# Patient Record
Sex: Male | Born: 1980 | Race: Black or African American | Hispanic: No | Marital: Single | Smoking: Never smoker
Health system: Southern US, Community
[De-identification: ages and names within clinical notes are randomized; demographics above are authoritative.]

---

## 2017-08-15 ENCOUNTER — Emergency Department
Admission: EM | Admit: 2017-08-15 | Discharge: 2017-08-15 | Disposition: A | Discharge status: Home / Self Care | Payer: 59 | Attending: Emergency Medicine | Admitting: Emergency Medicine

## 2017-08-15 ENCOUNTER — Encounter: Payer: Self-pay | Admitting: Emergency Medicine

## 2017-08-15 ENCOUNTER — Emergency Department: Payer: 59

## 2017-08-15 DIAGNOSIS — S63502A Unspecified sprain of left wrist, initial encounter: Secondary | ICD-10-CM | POA: Diagnosis not present

## 2017-08-15 DIAGNOSIS — W0110XA Fall on same level from slipping, tripping and stumbling with subsequent striking against unspecified object, initial encounter: Secondary | ICD-10-CM | POA: Diagnosis not present

## 2017-08-15 DIAGNOSIS — Y999 Unspecified external cause status: Secondary | ICD-10-CM | POA: Insufficient documentation

## 2017-08-15 DIAGNOSIS — Y9301 Activity, walking, marching and hiking: Secondary | ICD-10-CM | POA: Insufficient documentation

## 2017-08-15 DIAGNOSIS — Y929 Unspecified place or not applicable: Secondary | ICD-10-CM | POA: Diagnosis not present

## 2017-08-15 DIAGNOSIS — S6992XA Unspecified injury of left wrist, hand and finger(s), initial encounter: Secondary | ICD-10-CM | POA: Diagnosis present

## 2017-08-15 NOTE — ED Provider Notes (Signed)
[facility] Emergency Department Provider Note   ____________________________________________   First MD Initiated Contact with Patient 08/15/17 1314     (approximate)  I have reviewed the triage vital signs and the nursing notes.   HISTORY  Chief Complaint Wrist Injury   HPI Kyle Cox is a 37 y.o. male is here complaint left wrist pain.  Patient states that he fell and caught himself on his left arm yesterday.  Patient states he has had pains every since.  He continues to be able to move his wrist without any difficulties.  He is worried that it is broken.  He denies any head injury or loss of consciousness during his fall.  He rates his pain as 7 out of 10.   History reviewed. No pertinent past medical history.  There are no active problems to display for this patient.   History reviewed. No pertinent surgical history.  Prior to Admission medications   Not on File    Allergies Patient has no allergy information on record.  No family history on file.  Social History Social History   Tobacco Use  . Smoking status: Never Smoker  . Smokeless tobacco: Never Used  Substance Use Topics  . Alcohol use: Never    Frequency: Never  . Drug use: Never    Review of Systems Constitutional: No fever/chills Cardiovascular: Denies chest pain. Respiratory: Denies shortness of breath. Gastrointestinal:   No nausea, no vomiting.  Musculoskeletal: Positive for left wrist pain. Skin: Negative for injury. Neurological: Negative for  focal weakness or numbness. ___________________________________________   PHYSICAL EXAM:  VITAL SIGNS: ED Triage Vitals  Enc Vitals Group     BP 08/15/17 1146 (!) 156/81     Pulse Rate 08/15/17 1146 89     Resp 08/15/17 1146 18     Temp 08/15/17 1146 98.3 F (36.8 C)     Temp Source 08/15/17 1146 Oral     SpO2 08/15/17 1146 97 %     Weight 08/15/17 1147 245 lb (111.1 kg)     Height 08/15/17 1147  (1.854  m)     Head Circumference --      Peak Flow --      Pain Score 08/15/17 1147 7     Pain Loc --      Pain Edu? --      Excl. in GC? --    Constitutional: Alert and oriented. Well appearing and in no acute distress. Eyes: Conjunctivae are normal.  Head: Atraumatic. Neck: No stridor.   Cardiovascular: Normal rate, regular rhythm. Grossly normal heart sounds.  Good peripheral circulation. Respiratory: Normal respiratory effort.  No retractions. Lungs CTAB. Musculoskeletal: On examination of left wrist there is no gross deformity no soft tissue swelling appreciated.  Patient is able to flex and extend wrist with little difficulties.  Skin is intact and no ecchymosis or abrasions were seen.  Motor and sensory function intact distal to the injury and capillary refill is less than 3 seconds. Neurologic:  Normal speech and language. No gross focal neurologic deficits are appreciated.  Skin:  Skin is warm, dry and intact.  Psychiatric: Mood and affect are normal. Speech and behavior are normal.  ____________________________________________   LABS (all labs ordered are listed, but only abnormal results are displayed)  Labs Reviewed - No data to display  RADIOLOGY  ED MD interpretation:   Left wrist x-ray is negative for fracture or dislocation.  Official radiology report(s): Dg Wrist Complete Left  Result Date: 08/15/2017 CLINICAL DATA:  Larey Seat on outstretched hand yesterday. Persistent left wrist pain. EXAM: LEFT WRIST - COMPLETE 3+ VIEW COMPARISON:  None in PACs FINDINGS: The bones are subjectively adequately mineralized. There is no acute fracture nor dislocation. The soft tissues are mildly swollen over the dorsum of the hand. The joint spaces are well-maintained. Specific attention to the scaphoid reveals no acute abnormality. There is expansile remodeling of the shaft of the proximal phalanx of the fifth finger which appears chronic. IMPRESSION: There is acute bony abnormality of the left  wrist. Electronically Signed   By: David  [place] M.D.   On: 08/15/2017 12:23    ____________________________________________   PROCEDURES  Procedure(s) performed: None  Procedures  Critical Care performed: No  ____________________________________________   INITIAL IMPRESSION / ASSESSMENT AND PLAN / ED COURSE  As part of my medical decision making, I reviewed the following data within the electronic MEDICAL RECORD NUMBER Notes from prior ED visits and [place] Controlled Substance Database  Patient was made aware that x-ray was negative for fracture.  Patient states that he is comfortable taking ibuprofen at home and he already has a wrist splint that he can use.  Patient is to follow-up with Dr. Odis Luster who is on-call for orthopedics if he continues to have problems with his wrist.  He was also instructed to ice and elevate if needed for swelling. ____________________________________________   FINAL CLINICAL IMPRESSION(S) / ED DIAGNOSES  Final diagnoses:  Left wrist sprain, initial encounter     ED Discharge Orders    None       Note:  This document was prepared using Dragon voice recognition software and may include unintentional dictation errors.    Tommi Rumps, PA-C 08/15/17 1336    Minna Antis, MD 08/15/17 1357

## 2017-08-15 NOTE — Discharge Instructions (Addendum)
Ice and elevation as needed for pain or swelling.  Begin taking ibuprofen for inflammation.  Wear your wrist splint for protection.  Follow-up with Dr. Odis Luster if any continued problems with your wrist.

## 2017-08-15 NOTE — ED Notes (Signed)
See triage note .  States he fell yesterday  Landed on left arm  Having pain to left wrist area   No deformity noted  Good pulses

## 2017-08-15 NOTE — ED Triage Notes (Signed)
Pt arrived with complaints of left wrist pain. Pt states he fell and caught himself on his left arm yesterday and has had pain since. Pt had full ROM with left wrist and pulses present and strong.

## 2017-08-15 NOTE — ED Notes (Signed)
Refuses splint, says he has one at home.

## 2019-06-05 IMAGING — CR DG WRIST COMPLETE 3+V*L*
1 series · 4 of 4 positions shown · non-contrast
Comparison: None in PACs

CLINICAL DATA: Fell on outstretched hand yesterday. Persistent left
wrist pain.

EXAM:
LEFT WRIST - COMPLETE 3+ VIEW

[Series 1: dg wrist complete left · 0.14mm/px · 4 of 4 slices shown]
[im 1/4]
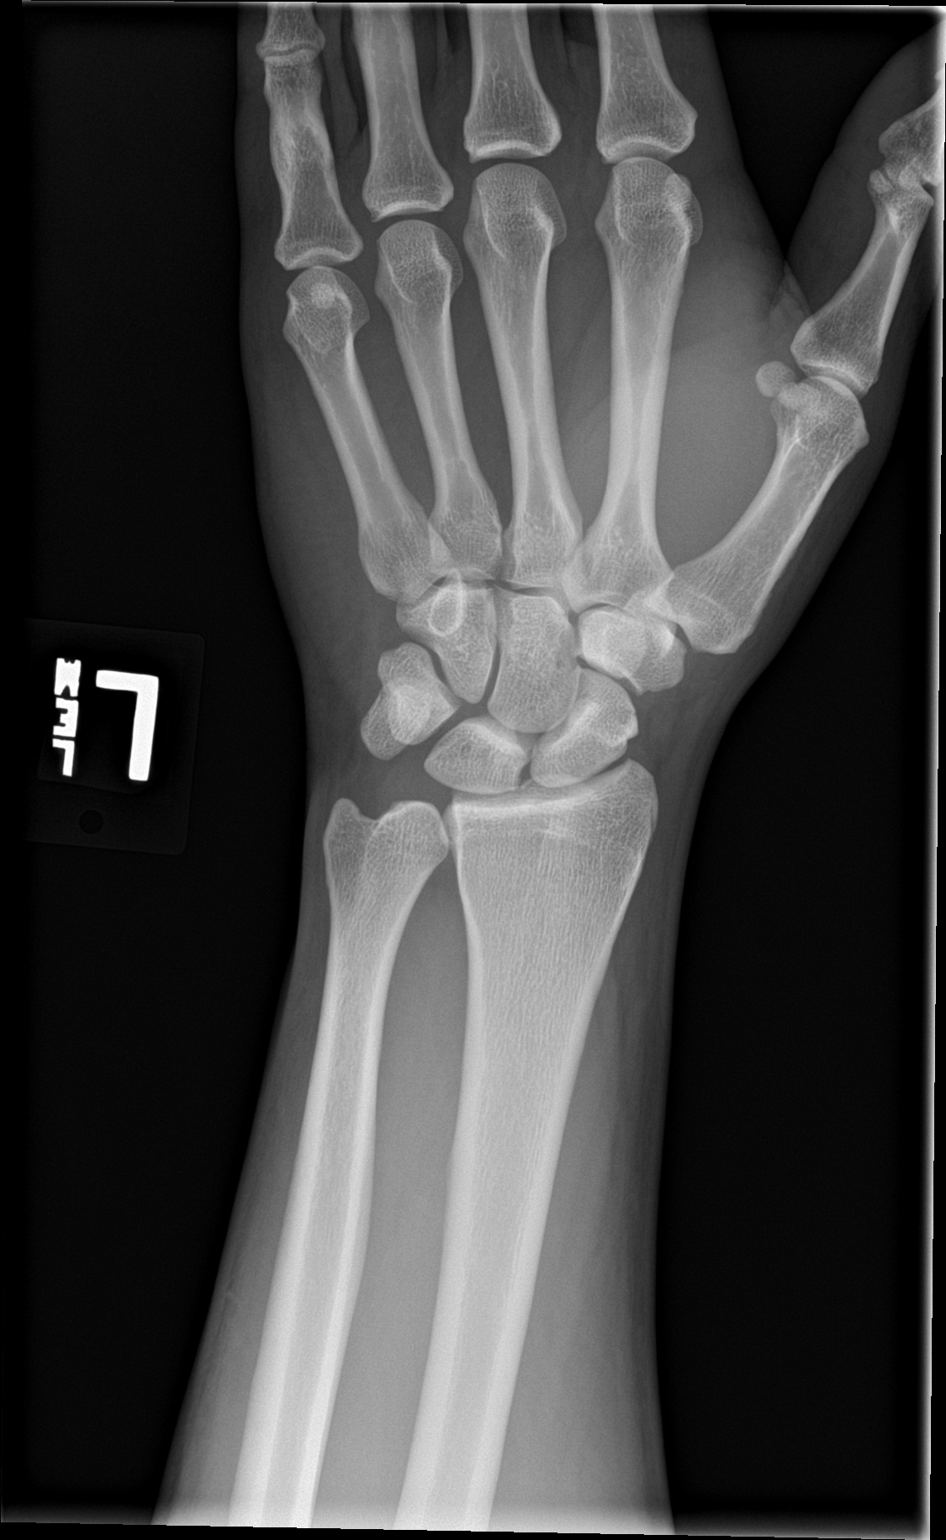
[im 2/4]
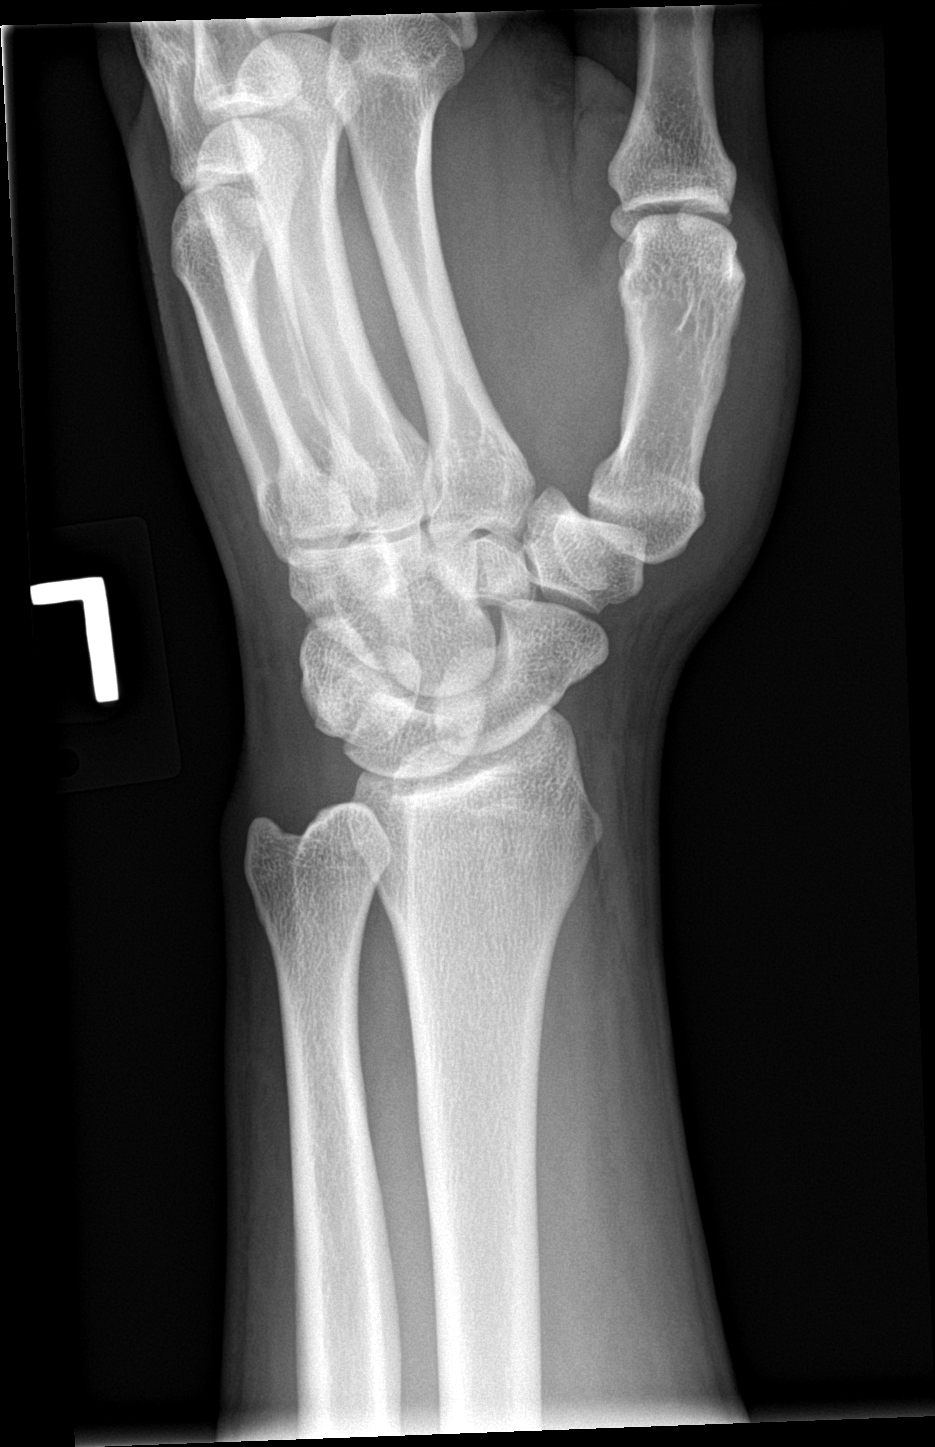
[im 3/4]
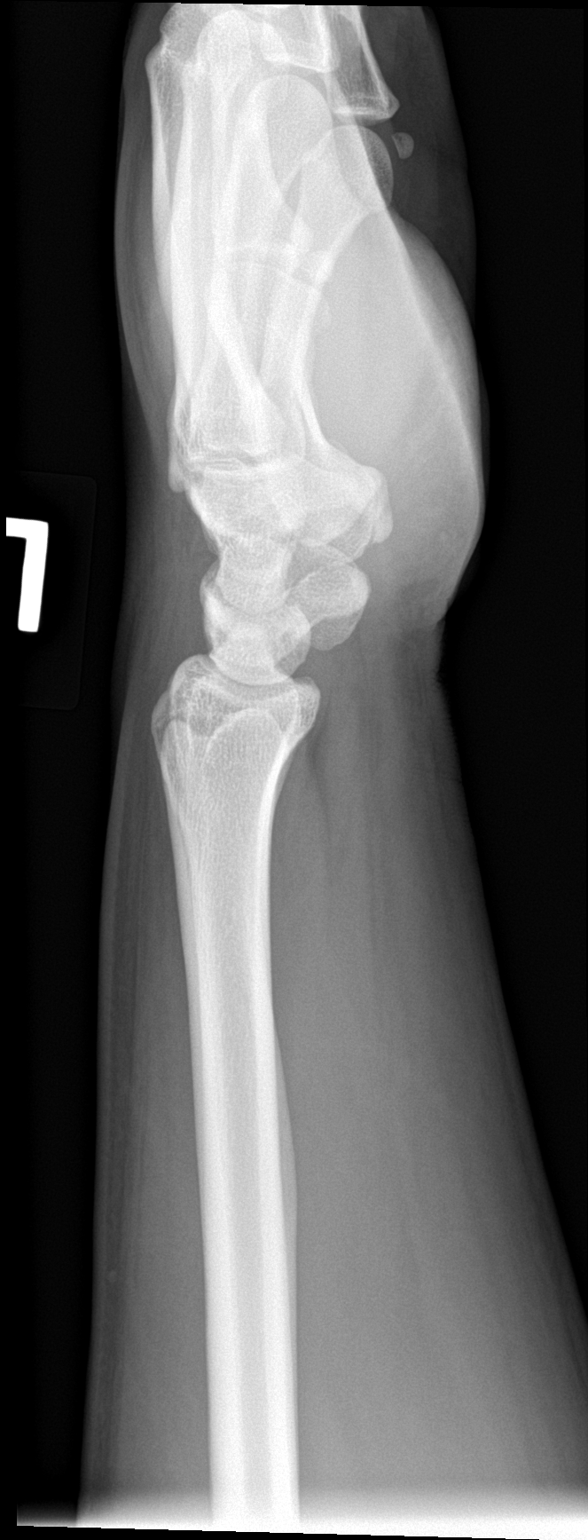
[im 4/4]
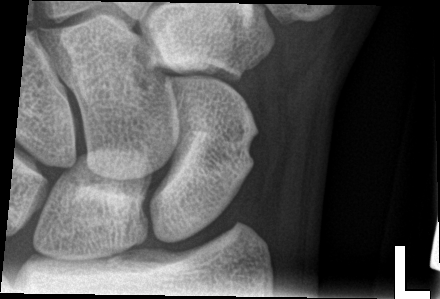

[4 of 4 positions shown; findings below may reference images not displayed]

FINDINGS: The bones are subjectively adequately mineralized. There is no acute
fracture nor dislocation. The soft tissues are mildly swollen over
the dorsum of the hand. The joint spaces are well-maintained.
Specific attention to the scaphoid reveals no acute abnormality.
There is expansile remodeling of the shaft of the proximal phalanx
of the fifth finger which appears chronic.
IMPRESSION: There is acute bony abnormality of the left wrist.
